# Patient Record
Sex: Male | Born: 2000 | Race: White | Hispanic: No | Marital: Single
Health system: Southern US, Community
[De-identification: ages and names within clinical notes are randomized; demographics above are authoritative.]

---
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
## 2018-12-03 ENCOUNTER — Other Ambulatory Visit: Payer: Self-pay

## 2018-12-03 ENCOUNTER — Encounter: Payer: Self-pay | Admitting: Emergency Medicine

## 2018-12-03 ENCOUNTER — Emergency Department: Payer: No Typology Code available for payment source

## 2018-12-03 ENCOUNTER — Emergency Department
Admission: EM | Admit: 2018-12-03 | Discharge: 2018-12-03 | Disposition: A | Discharge status: Home / Self Care | Payer: No Typology Code available for payment source | Attending: Emergency Medicine | Admitting: Emergency Medicine

## 2018-12-03 DIAGNOSIS — Y92410 Unspecified street and highway as the place of occurrence of the external cause: Secondary | ICD-10-CM | POA: Diagnosis not present

## 2018-12-03 DIAGNOSIS — S161XXA Strain of muscle, fascia and tendon at neck level, initial encounter: Secondary | ICD-10-CM | POA: Diagnosis not present

## 2018-12-03 DIAGNOSIS — Y9389 Activity, other specified: Secondary | ICD-10-CM | POA: Diagnosis not present

## 2018-12-03 DIAGNOSIS — S199XXA Unspecified injury of neck, initial encounter: Secondary | ICD-10-CM | POA: Diagnosis present

## 2018-12-03 DIAGNOSIS — Y999 Unspecified external cause status: Secondary | ICD-10-CM | POA: Diagnosis not present

## 2018-12-03 MED ORDER — MELOXICAM 15 MG PO TABS: 15.0000 mg | ORAL_TABLET | Freq: Every day | ORAL | 2 refills | Status: AC

## 2018-12-03 MED ORDER — METHOCARBAMOL 500 MG PO TABS: 500.0000 mg | ORAL_TABLET | Freq: Four times a day (QID) | ORAL | 0 refills | Status: AC

## 2018-12-03 NOTE — Discharge Instructions (Signed)
Follow-up with your regular doctor if not better in 5 to 7 days.  Return emergency department for worsening.  Apply ice to all areas that hurt.  Meloxicam for pain and inflammation.  Robaxin is a muscle relaxer.  Do not operate heavy machinery while taking this medication.  Medication lasts for approximately 8 hours so you could take it at night and return to work the next day without difficulty.
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
